# Patient Record
Sex: Female | Born: 2005 | Race: White | Hispanic: Yes | Marital: Single | Smoking: Never smoker
Health system: Southern US, Community
[De-identification: ages and names within clinical notes are randomized; demographics above are authoritative.]

---
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
## 2024-10-14 ENCOUNTER — Ambulatory Visit: Admitting: Dermatology

## 2024-10-14 ENCOUNTER — Encounter: Payer: Self-pay | Admitting: Dermatology

## 2024-10-14 VITALS — BP 113/87

## 2024-10-14 DIAGNOSIS — L649 Androgenic alopecia, unspecified: Secondary | ICD-10-CM | POA: Diagnosis not present

## 2024-10-14 DIAGNOSIS — L700 Acne vulgaris: Secondary | ICD-10-CM

## 2024-10-14 MED ORDER — TRETINOIN 0.05 % EX CREA: TOPICAL_CREAM | CUTANEOUS | 1 refills | Status: AC

## 2024-10-14 MED ORDER — SAFETY SEAL MISCELLANEOUS MISC: 11 refills | Status: AC

## 2024-10-14 NOTE — Progress Notes (Signed)
" ° °  New Patient Visit   Subjective  Linda Kennedy is a 19 y.o. female who presents for a NEW PATIENT appointment to be examined for the concerns as listed below.   Hair Loss: Pt stated that she has noticed hair thinning at the temples at the age of 49. She stated that her diet during high school was not the best and did not eat much nutritional food. She is currently taking pumpkin seed capsules to help combat the shedding. She has seen some improvement but her hair is not as thick as it once was. She has previously tried an rosemary and avocado oil that she made at home in additional to derma rolling.   Add On: Would like to discuss a skin regimen.    The following portions of the chart were reviewed this encounter and updated as appropriate: medications, allergies, medical history  Review of Systems:  No other skin or systemic complaints except as noted in HPI or Assessment and Plan.  Objective  Well appearing patient in no apparent distress; mood and affect are within normal limits.   A focused examination was performed of the following areas: scalp   Relevant exam findings are noted in the Assessment and Plan.             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 Likely due to genetic and hormonal factors. Early treatment is advised to improve response and maintain hair. Over-the-counter remedies provided temporary improvement but were ineffective long-term. Minoxidil is the cornerstone of treatment, with prescription formulation offering higher concentration and better efficacy when combined with finasteride. Treatment requires consistent application for optimal results, with noticeable improvement in 4-5 months and full results in about a year. Insurance does not cover compounded medications, but the cost is comparable to over-the-counter minoxidil. - Prescribed  compounded minoxidil 8% with finasteride from [facility]. - Instructed to apply the compounded solution once daily using a Q-tip or cotton ball to avoid unwanted hair growth on the face. - Advised to continue using pumpkin seed oil and rosemary oil at night if desired. - Educated on the importance of consistent application to prevent hair thinning from returning. - Discussed potential side effects and safety with pets, emphasizing not to let cats lick the treated area.  Acne vulgaris Acne present on cheeks, likely due to hormonal changes and sensitivity. Tretinoin  0.05% cream has been used inconsistently but shows effectiveness when used consistently. Salicylic acid wash recommended to exfoliate and reduce oiliness. Prescription acne treatments like Prescarig may be considered for faster results, requiring 3-6 sessions. - Continue tretinoin  0.05% cream every other night. - Use a salicylic acid wash in the morning to exfoliate and reduce oiliness. - Provided samples of La Roche-Posay salicylic acid wash. - Recommended using a moisturizer like La Roche-Posay triple repair after tretinoin  application. - Discussed the option of prescription acne treatments like Prescarig for faster results.    No follow-ups on file.   Documentation: I have reviewed the above documentation for accuracy and completeness, and I agree with the above.  I, Shirron Maranda, CMA II, am acting as scribe for:   Delon Lenis, DO     "

## 2024-10-14 NOTE — Patient Instructions (Addendum)
 VISIT SUMMARY:  Today, we discussed your concerns about hair thinning at the temples and acne on your cheeks. We reviewed your current treatments and introduced new options to help manage these issues effectively.  YOUR PLAN:  -ANDROGENETIC ALOPECIA:  Androgenetic alopecia is a common form of hair loss influenced by genetic and hormonal factors. We have prescribed a compounded solution of minoxidil 8% with finasteride to be applied once daily using a Q-tip or cotton ball.   You can continue using pumpkin seed oil and rosemary oil at night if you wish. Consistent application is crucial for preventing further hair thinning. We also discussed the safety of this treatment around your pets, ensuring your cats do not lick the treated area.  -ACNE VULGARIS:  Acne vulgaris is a skin condition characterized by pimples, often due to hormonal changes and sensitivity.   Continue using tretinoin  0.05% cream every other night and start using a salicylic acid wash in the morning to help exfoliate and reduce oiliness. We provided samples of La Roche-Posay salicylic acid wash and recommended using a moisturizer like La Roche-Posay triple repair after applying tretinoin .   We also discussed the option of prescription acne treatments like Prescarig for faster results.  INSTRUCTIONS:  Please follow up in 4-5 months to assess the progress of your hair treatment. If you have any concerns or notice any side effects, contact us  immediately. Continue with your current skincare routine and let us  know if you decide to pursue prescription acne treatments.       Important Information  Due to recent changes in healthcare laws, you may see results of your pathology and/or laboratory studies on MyChart before the doctors have had a chance to review them. We understand that in some cases there may be results that are confusing or concerning to you. Please understand that not all results are received at the same time and  often the doctors may need to interpret multiple results in order to provide you with the best plan of care or course of treatment. Therefore, we ask that you please give us  2 business days to thoroughly review all your results before contacting the office for clarification. Should we see a critical lab result, you will be contacted sooner.   If You Need Anything After Your Visit  If you have any questions or concerns for your doctor, please call our main line at [phone] If no one answers, please leave a voicemail as directed and we will return your call as soon as possible. Messages left after 4 pm will be answered the following business day.   You may also send us  a message via MyChart. We typically respond to MyChart messages within 1-2 business days.  For prescription refills, please ask your pharmacy to contact our office. Our fax number is [phone].  If you have an urgent issue when the clinic is closed that cannot wait until the next business day, you can page your doctor at the number below.    Please note that while we do our best to be available for urgent issues outside of office hours, we are not available 24/7.   If you have an urgent issue and are unable to reach us , you may choose to seek medical care at your doctor's office, retail clinic, urgent care center, or emergency room.  If you have a medical emergency, please immediately call 911 or go to the emergency department. In the event of inclement weather, please call our main line at [phone] for  an update on the status of any delays or closures.  Dermatology Medication Tips: Please keep the boxes that topical medications come in in order to help keep track of the instructions about where and how to use these. Pharmacies typically print the medication instructions only on the boxes and not directly on the medication tubes.   If your medication is too expensive, please contact our office at [phone] or send us   a message through MyChart.   We are unable to tell what your co-pay for medications will be in advance as this is different depending on your insurance coverage. However, we may be able to find a substitute medication at lower cost or fill out paperwork to get insurance to cover a needed medication.   If a prior authorization is required to get your medication covered by your insurance company, please allow us  1-2 business days to complete this process.  Drug prices often vary depending on where the prescription is filled and some pharmacies may offer cheaper prices.  The website www.goodrx.com contains coupons for medications through different pharmacies. The prices here do not account for what the cost may be with help from insurance (it may be cheaper with your insurance), but the website can give you the price if you did not use any insurance.  - You can print the associated coupon and take it with your prescription to the pharmacy.  - You may also stop by our office during regular business hours and pick up a GoodRx coupon card.  - If you need your prescription sent electronically to a different pharmacy, notify our office through [facility] or by phone at [phone]
